# Patient Record
Sex: Female | Born: 1970 | Race: White | Hispanic: No | Marital: Married | Smoking: Former smoker
Health system: Southern US, Community
[De-identification: ages and names within clinical notes are randomized; demographics above are authoritative.]

## PROBLEM LIST (undated history)

## (undated) DIAGNOSIS — I10 Essential (primary) hypertension: Secondary | ICD-10-CM

## (undated) DIAGNOSIS — K219 Gastro-esophageal reflux disease without esophagitis: Secondary | ICD-10-CM

## (undated) HISTORY — PX: MASTOIDECTOMY: SHX711

## (undated) HISTORY — PX: ENDOMETRIAL ABLATION: SHX621

---

## 2000-01-05 ENCOUNTER — Inpatient Hospital Stay (HOSPITAL_COMMUNITY): Admission: AD | Admit: 2000-01-05 | Discharge: 2000-01-07 | Payer: Self-pay | Admitting: *Deleted

## 2001-03-12 ENCOUNTER — Other Ambulatory Visit: Admission: RE | Admit: 2001-03-12 | Discharge: 2001-03-12 | Payer: Self-pay | Admitting: *Deleted

## 2001-04-19 ENCOUNTER — Encounter: Admission: RE | Admit: 2001-04-19 | Discharge: 2001-04-19 | Payer: Self-pay | Admitting: Family Medicine

## 2001-04-19 ENCOUNTER — Encounter: Payer: Self-pay | Admitting: Family Medicine

## 2002-10-15 ENCOUNTER — Other Ambulatory Visit: Admission: RE | Admit: 2002-10-15 | Discharge: 2002-10-15 | Payer: Self-pay | Admitting: Obstetrics and Gynecology

## 2009-09-10 ENCOUNTER — Ambulatory Visit (HOSPITAL_COMMUNITY)
Admission: RE | Admit: 2009-09-10 | Discharge: 2009-09-10 | Payer: Self-pay | Source: Home / Self Care | Admitting: Otolaryngology

## 2009-12-06 ENCOUNTER — Emergency Department (HOSPITAL_BASED_OUTPATIENT_CLINIC_OR_DEPARTMENT_OTHER)
Admission: EM | Admit: 2009-12-06 | Discharge: 2009-12-06 | Payer: Self-pay | Source: Home / Self Care | Admitting: Emergency Medicine

## 2009-12-06 ENCOUNTER — Ambulatory Visit: Payer: Self-pay | Admitting: Diagnostic Radiology

## 2009-12-15 ENCOUNTER — Emergency Department (HOSPITAL_BASED_OUTPATIENT_CLINIC_OR_DEPARTMENT_OTHER): Admission: EM | Admit: 2009-12-15 | Discharge: 2009-12-15 | Payer: Self-pay | Admitting: Emergency Medicine

## 2010-04-06 ENCOUNTER — Encounter: Admission: RE | Admit: 2010-04-06 | Discharge: 2010-04-06 | Payer: Self-pay | Admitting: Obstetrics and Gynecology

## 2010-04-16 ENCOUNTER — Encounter: Admission: RE | Admit: 2010-04-16 | Discharge: 2010-04-16 | Payer: Self-pay | Admitting: Obstetrics and Gynecology

## 2010-09-20 LAB — DIFFERENTIAL
Basophils Relative: 1 % (ref 0–1)
Eosinophils Absolute: 0.1 10*3/uL (ref 0.0–0.7)
Eosinophils Relative: 2 % (ref 0–5)
Monocytes Relative: 9 % (ref 3–12)
Neutrophils Relative %: 61 % (ref 43–77)

## 2010-09-20 LAB — BASIC METABOLIC PANEL
BUN: 10 mg/dL (ref 6–23)
CO2: 26 mEq/L (ref 19–32)
Calcium: 9.4 mg/dL (ref 8.4–10.5)
Chloride: 108 mEq/L (ref 96–112)
Creatinine, Ser: 0.7 mg/dL (ref 0.4–1.2)
GFR calc Af Amer: >60 mL/min (ref >60)
GFR calc non Af Amer: >60 mL/min (ref >60)
Glucose, Bld: 90 mg/dL (ref 70–99)
Potassium: 4.2 mEq/L (ref 3.5–5.1)
Sodium: 145 mEq/L (ref 135–145)

## 2010-09-20 LAB — POCT CARDIAC MARKERS
Myoglobin, poc: 43 ng/mL (ref 12–200)
Myoglobin, poc: 62 ng/mL (ref 12–200)
Troponin i, poc: <0.05 ng/mL (ref 0.00–0.09)
Troponin i, poc: <0.05 ng/mL (ref 0.00–0.09)

## 2010-09-20 LAB — CBC
MCHC: 34 g/dL (ref 30.0–36.0)
MCV: 90.4 fL (ref 78.0–100.0)
Platelets: 201 10*3/uL (ref 150–400)
RBC: 4.74 MIL/uL (ref 3.87–5.11)

## 2010-09-20 LAB — D-DIMER, QUANTITATIVE: D-Dimer, Quant: <0.22 ug/mL-FEU (ref 0.00–0.48)

## 2010-09-27 LAB — CBC
HCT: 42.5 % (ref 36.0–46.0)
MCHC: 34.9 g/dL (ref 30.0–36.0)
MCV: 92.7 fL (ref 78.0–100.0)
Platelets: 203 10*3/uL (ref 150–400)
RDW: 12.9 % (ref 11.5–15.5)

## 2010-09-27 LAB — DIFFERENTIAL
Lymphocytes Relative: 15 % (ref 12–46)
Monocytes Absolute: 0.8 10*3/uL (ref 0.1–1.0)
Monocytes Relative: 9 % (ref 3–12)
Neutro Abs: 7.5 10*3/uL (ref 1.7–7.7)
Neutrophils Relative %: 75 % (ref 43–77)

## 2010-09-27 LAB — APTT: aPTT: 28 seconds (ref 24–37)

## 2010-09-27 LAB — COMPREHENSIVE METABOLIC PANEL
Albumin: 4.1 g/dL (ref 3.5–5.2)
BUN: 8 mg/dL (ref 6–23)
Calcium: 9.7 mg/dL (ref 8.4–10.5)
Creatinine, Ser: 0.68 mg/dL (ref 0.4–1.2)
Glucose, Bld: 126 mg/dL — ABNORMAL HIGH (ref 70–99)
Total Protein: 7.2 g/dL (ref 6.0–8.3)

## 2010-09-27 LAB — PROTIME-INR
INR: 1.07 (ref 0.00–1.49)
Prothrombin Time: 13.8 seconds (ref 11.6–15.2)

## 2010-11-19 NOTE — H&P (Signed)
[facility]  Patient:    Christine Foster, Christine Foster                       MRN: [id number] Adm. Date:  01/05/00 Attending:  Lenoard Aden, M.D.                         History and Physical  CHIEF COMPLAINT:                  Spontaneous rupture of membranes in active labor.  HISTORY OF PRESENT ILLNESS:       The patient is a 40 year old white female, G3, P1-0-2-1, EDD of December 31, 1999, at 40+ weeks with spontaneous rupture of membranes at 1 p.m. in active labor.  PAST MEDICAL HISTORY:             Remarkable for two TABs in 1989 and 1991 and an uncomplicated vaginal delivery of a 7 pound 6 ounce female in 34.  She has a family history of cardiovascular disease, diabetes, and kidney disease. Her past medical history is remarkable for irritable bowel syndrome and one pregnancy complicated by preterm labor.  ALLERGIES:                        No known drug allergies.  MEDICATIONS:                      Prenatal vitamins and iron.  PRENATAL LABORATORY DATA:         Blood type 0 negative, Rh antibody negative, VDRL nonreactive, hepatitis B surface antigen negative, HIV nonreactive, GC and chlamydia negative, group B strep negative.  PHYSICAL EXAMINATION:  GENERAL:                          A well-developed, well-nourished white female in no apparent distress.  HEENT:                            Normal.  LUNGS:                            Clear.  HEART:                            Regular rate and rhythm.  ABDOMEN:                          Soft, gravid, nontender.  Estimated fetal weight 7-1/2 to 8 pounds.  CERVIX:                           Per R.N., 5, 90% vertex, 0, clear fluid noted.  EXTREMITIES:                      No cords.  NEUROLOGIC:                       Nonfocal.  IMPRESSION:                       Term intrauterine pregnancy with spontaneous rupture of membranes in active labor.  PLAN:  Admit.  Anticipate  vaginal delivery, external fetal monitoring. DD:  01/05/00 TD:  01/05/00 Job: [id number]

## 2010-11-19 NOTE — Op Note (Signed)
[facility]  Patient:    Christine Foster, Christine Foster                       MRN: [id number] Proc. Date: 01/05/00 Attending:  Lenoard Aden, M.D.                           Operative Report  SURGEON:                      Lenoard Aden, M.D.  DELIVERY INDICATIONS/PREOPERATIVE DIAGNOSIS:  A 40+ week intrauterine pregnancy  with nonreassuring fetal heart rate tracing, deep variable decelerations, and poor maternal expulsive efforts.  POSTOPERATIVE DIAGNOSIS:      A 40+ week intrauterine pregnancy with nonreassuring fetal heart rate tracing, deep variable decelerations, and poor maternal expulsive efforts, plus nuchal cord times 2.  The patient had been pushing for approximately 30 minutes, when deep variable accelerations, with slow return to baseline were  noted.  Good variability was noted.  Fetal heart tones down to the 80s-90s, maintaining in the 100-110 beat per minute range.  Due to the persistent nonreassurance of this tracing and inadequate expulsive efforts, fetal vertex is noted to be at +3, and left occipital anterior less than 45 degrees.  PROCEDURE:                    A vacuum procedure and assisted vaginal delivery.  DESCRIPTION OF PROCEDURE:     A vacuum procedure and assisted vaginal delivery ere explained to the patient and to her husband.  They acknowledged and desired to proceed.  At this time a ______vac M-cup is applied properly.  The fetal vertex, with three pulls the fetal vertex is delivered over a second-degree laceration.  Full-term living female fetus with a nuchal cord x 2, reduced on the perineum.  Apgars 8 and 9.  The fetus is bulb suctioned after delivery.  The cervix is inspected and found to be without lacerations.  The placenta was delivered spontaneously and intact.  A three-vessel cord is noted.  Inspection reveals no  inspection of lateral vaginal wall lacerations or periurethral lacerations. There is a small  extension of the midline laceration over to the left side of the vagina. This is repaired in the standard fashion using a #3-0 Vicryl Rapide, and using local anesthetic.  At this time reinspection reveals good hemostasis and no lacerations.  Estimated blood loss is 600 cc.  The patient tolerates this procedure well and is recovering with the baby, in good condition. DD:  01/05/00 TD:  01/05/00 Job: [id number]

## 2011-04-25 ENCOUNTER — Other Ambulatory Visit: Payer: Self-pay | Admitting: Obstetrics and Gynecology

## 2011-04-25 DIAGNOSIS — Z1231 Encounter for screening mammogram for malignant neoplasm of breast: Secondary | ICD-10-CM

## 2011-05-10 ENCOUNTER — Ambulatory Visit: Payer: Self-pay

## 2011-05-25 ENCOUNTER — Ambulatory Visit: Payer: Self-pay

## 2011-05-30 ENCOUNTER — Ambulatory Visit
Admission: RE | Admit: 2011-05-30 | Discharge: 2011-05-30 | Disposition: A | Discharge status: Home / Self Care | Payer: Self-pay | Source: Ambulatory Visit | Attending: Obstetrics and Gynecology | Admitting: Obstetrics and Gynecology

## 2011-05-30 DIAGNOSIS — Z1231 Encounter for screening mammogram for malignant neoplasm of breast: Secondary | ICD-10-CM

## 2012-01-17 ENCOUNTER — Other Ambulatory Visit: Payer: Self-pay | Admitting: Otolaryngology

## 2012-01-17 DIAGNOSIS — H7100 Cholesteatoma of attic, unspecified ear: Secondary | ICD-10-CM

## 2012-01-17 DIAGNOSIS — H95129 Granulation of postmastoidectomy cavity, unspecified ear: Secondary | ICD-10-CM

## 2012-01-24 ENCOUNTER — Ambulatory Visit
Admission: RE | Admit: 2012-01-24 | Discharge: 2012-01-24 | Disposition: A | Discharge status: Home / Self Care | Payer: BC Managed Care – PPO | Source: Ambulatory Visit | Attending: Otolaryngology | Admitting: Otolaryngology

## 2012-01-24 DIAGNOSIS — H7100 Cholesteatoma of attic, unspecified ear: Secondary | ICD-10-CM

## 2012-01-24 DIAGNOSIS — H95129 Granulation of postmastoidectomy cavity, unspecified ear: Secondary | ICD-10-CM

## 2012-01-24 MED ORDER — IOHEXOL 300 MG/ML  SOLN
75.0000 mL | Freq: Once | INTRAMUSCULAR | Status: AC | PRN
  Administered 2012-01-24: 75 mL via INTRAVENOUS

## 2012-02-21 ENCOUNTER — Other Ambulatory Visit: Payer: Self-pay | Admitting: Otolaryngology

## 2012-04-25 ENCOUNTER — Other Ambulatory Visit: Payer: Self-pay | Admitting: Obstetrics and Gynecology

## 2012-04-25 DIAGNOSIS — Z1231 Encounter for screening mammogram for malignant neoplasm of breast: Secondary | ICD-10-CM

## 2012-06-06 ENCOUNTER — Ambulatory Visit
Admission: RE | Admit: 2012-06-06 | Discharge: 2012-06-06 | Disposition: A | Discharge status: Home / Self Care | Payer: BC Managed Care – PPO | Source: Ambulatory Visit | Attending: Obstetrics and Gynecology | Admitting: Obstetrics and Gynecology

## 2012-06-06 DIAGNOSIS — Z1231 Encounter for screening mammogram for malignant neoplasm of breast: Secondary | ICD-10-CM

## 2012-12-12 IMAGING — CT CT TEMPORAL BONES W/ CM
4 of 6 series · 18 of 30 positions shown, 19 images · IV contrast (75CC OMNI 300)
Comparison: None.

CLINICAL DATA: Left ear mixed hearing loss for 1 year.

CT TEMPORAL BONES WITH CONTRAST
TECHNIQUE: Axial and coronal plane CT imaging of the petrous
temporal bones was performed with thin-collimation image
reconstruction after intravenous contrast administration.
Multiplanar CT image reconstructions were also generated.
Contrast: 75mL OMNIPAQUE IOHEXOL 300 MG/ML  SOLN

[Series 3: ax mag right · axial · 0.20mm/px · z∈[-1,+24]mm · 3 of 161 slices shown]
[im 41/161  bone]
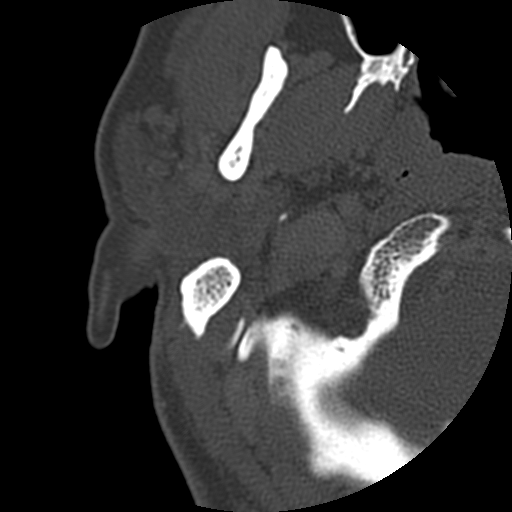
[im 81/161  bone]
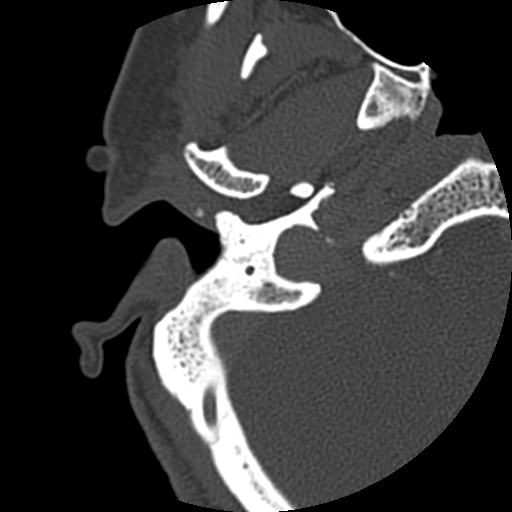
[im 121/161  bone]
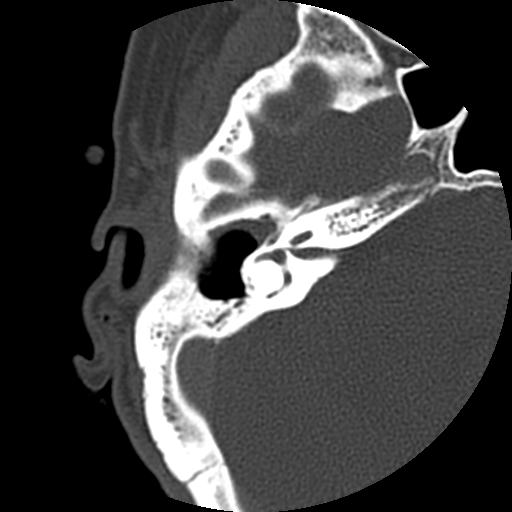

[Series 200: cor · axial · 0.33mm/px · z∈[+13,+98]mm · 3 of 81 slices shown, 4 images]
[im 1/81  brain]
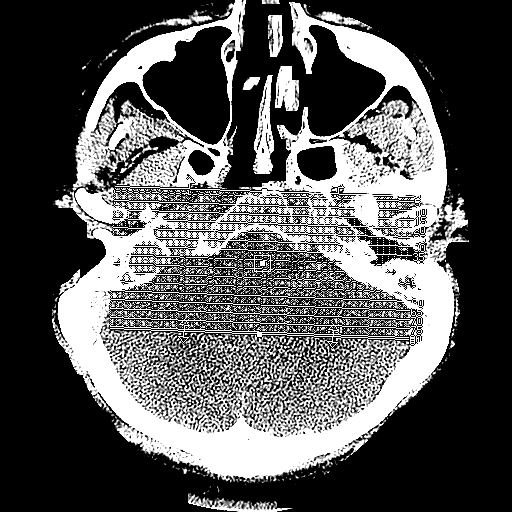
[im 1/81  bone]
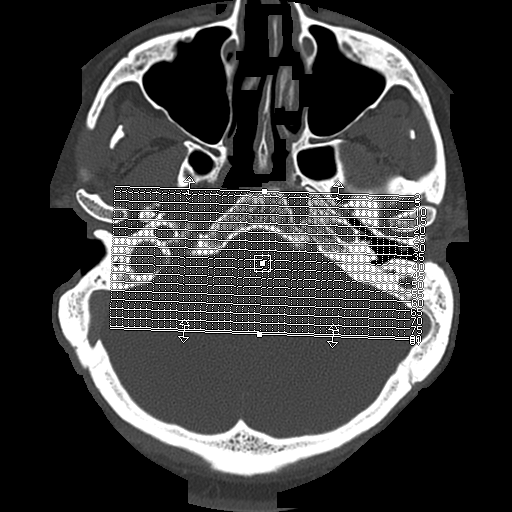
[im 41/81  bone]
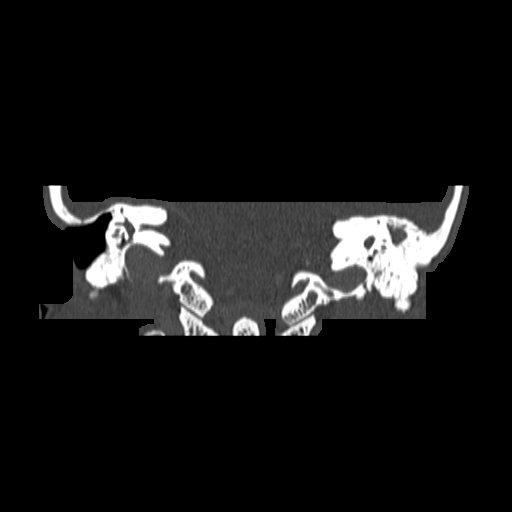
[im 81/81  bone]
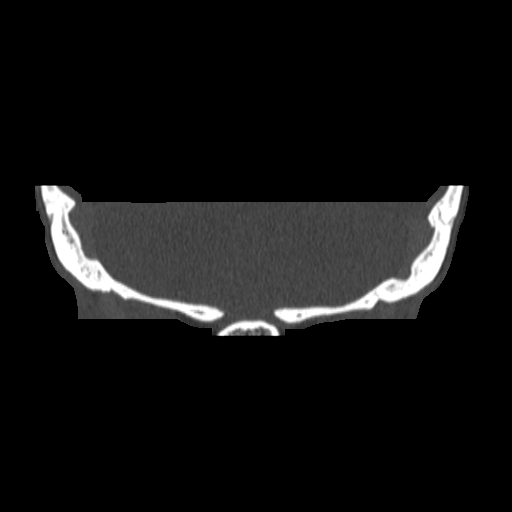

[Series 300: rt cor · coronal · 0.20mm/px · 6 of 249 slices shown]
[im 36/249  bone]
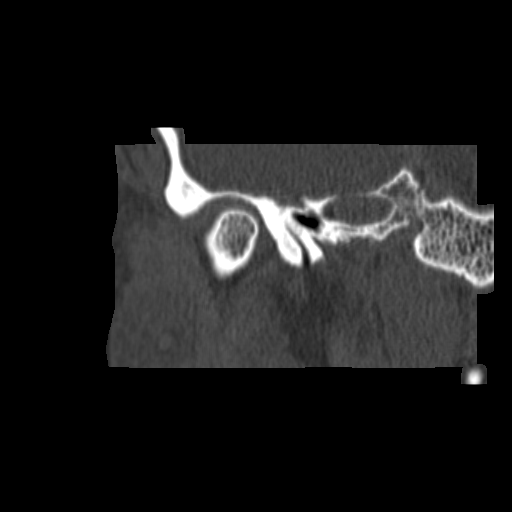
[im 71/249  bone]
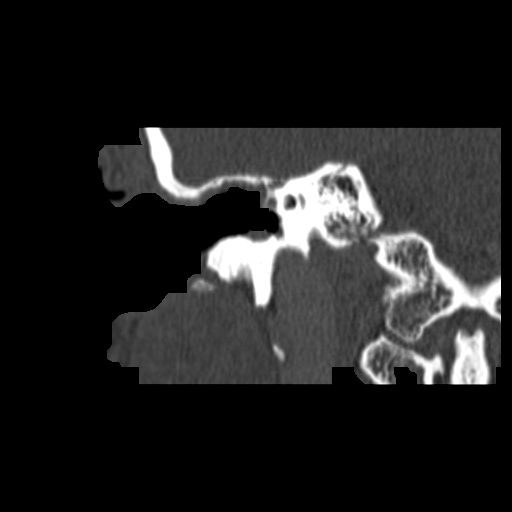
[im 107/249  bone]
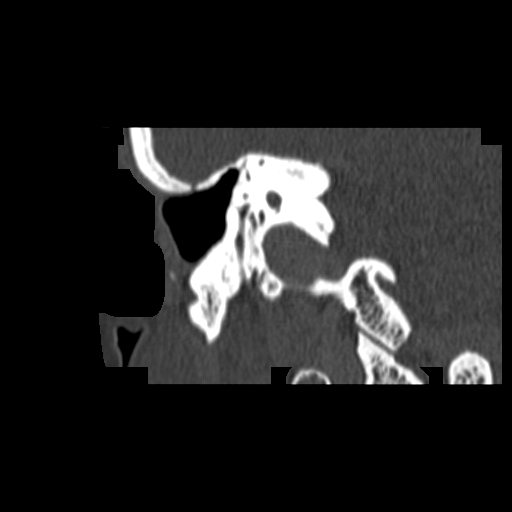
[im 142/249  bone]
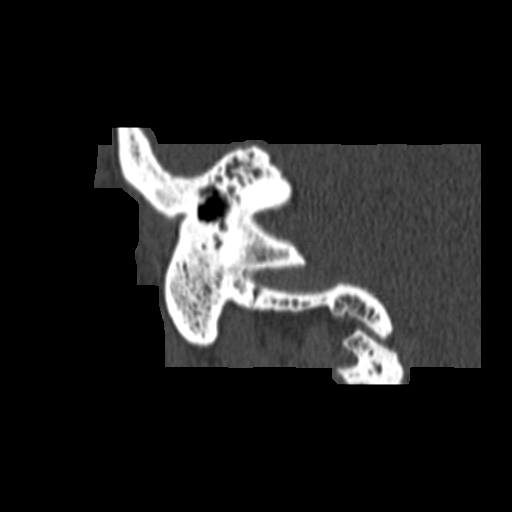
[im 178/249  bone]
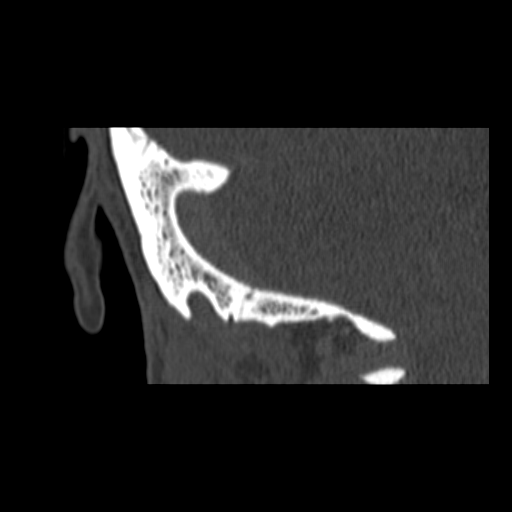
[im 213/249  bone]
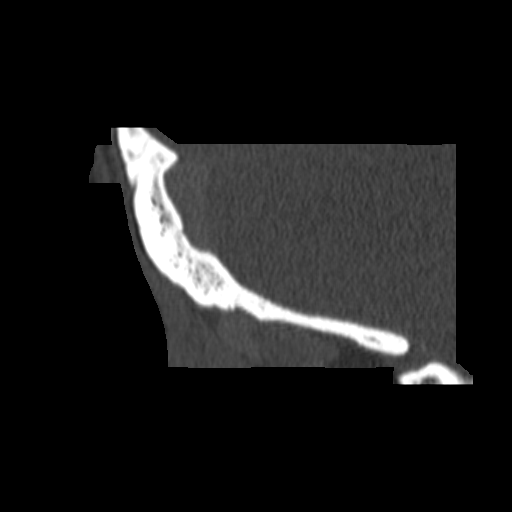

[Series 400: lt cor · coronal · 0.20mm/px · 6 of 243 slices shown]
[im 35/243  bone]
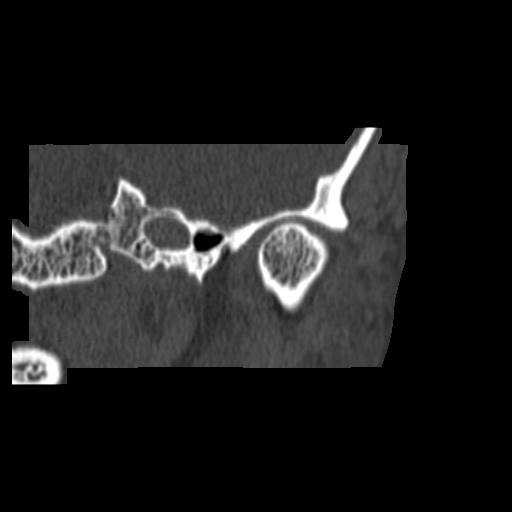
[im 70/243  bone]
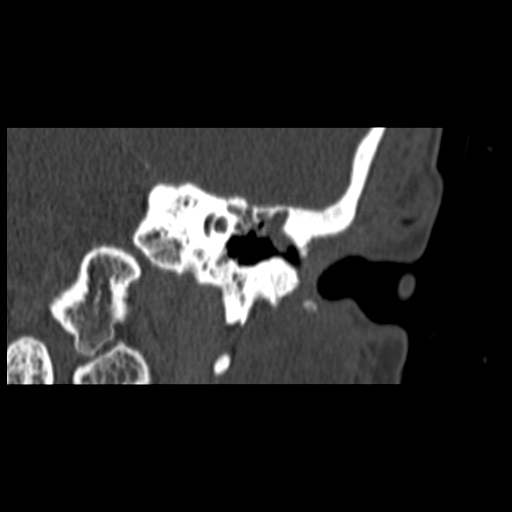
[im 104/243  bone]
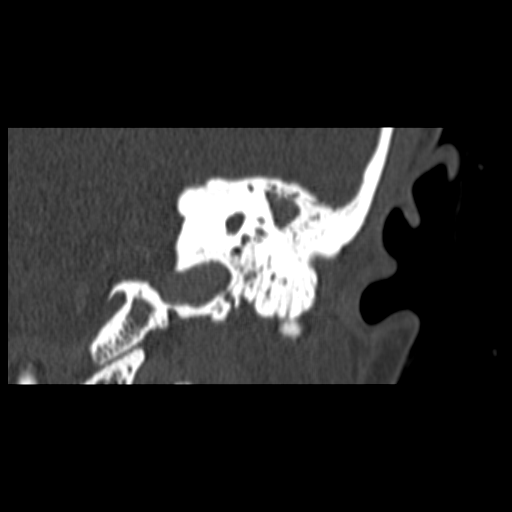
[im 139/243  bone]
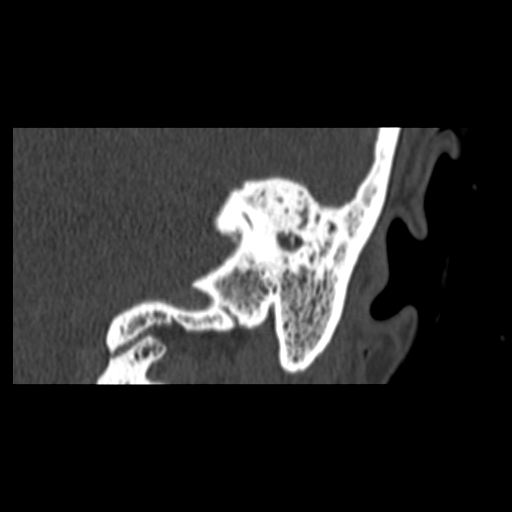
[im 173/243  bone]
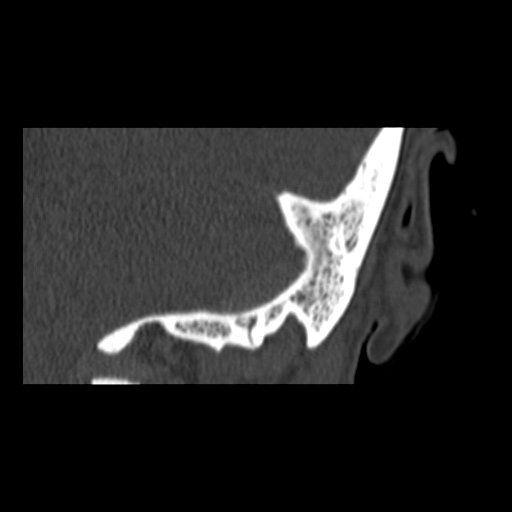
[im 208/243  bone]
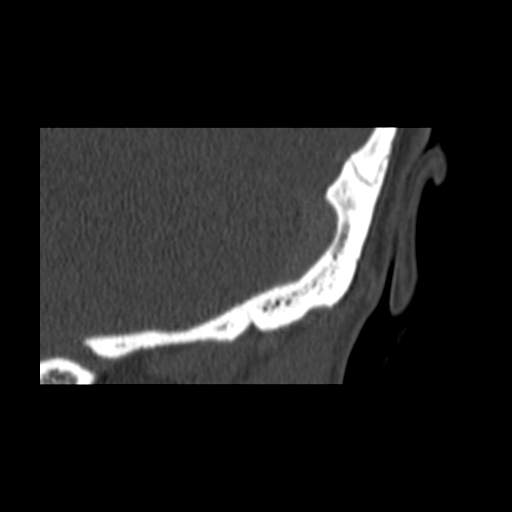

[18 of 30 positions shown; findings below may reference images not displayed]

FINDINGS: Right ear:  Prior modified radical mastoidectomy. Incompletely
evaluated right-sided Annalee implant.  External canal widely patent.
Moderate right tympanic membrane thickening.  Poorly defined
ossicular chain.  Long arm of the malleus is observed centrally.
Soft tissue density erodes the tegmen tympani on the right tegmen
mastoideum is intact.  Soft tissue density medially adjacent to the
facial nerve canal. Inner ear structures normal.

Left ear: Cholesteatoma surrounds the head of the malleus
obliterating Prussak's space. This cholesteatoma extends downward
projecting into the bony external canal. There is destruction of
the scutum.  Slight thinning tegmen tympani ( image 90 series 400).
This cholesteatoma measures approximately 4 x 5 x 8 mm.  Tympanic
membrane is intact.  Incus and stapes grossly normal in position.
There is posterior extension into the mastoid where there is
chronic sclerosis.  Tegmen mastoideum slightly thinned  (images
115, 128, series 400).  Inner ear structures normal.

Visualized intracranial compartment unremarkable.
IMPRESSION: Left middle ear cholesteatoma extending into the mastoid as well as
inferiorly into the left external canal.  Slight thinning of the
tegmen tympani and tegmen mastoideum. See comments above.

Chronic changes right ear status post mastoidectomy and Annalee
device.

## 2013-07-30 ENCOUNTER — Other Ambulatory Visit: Payer: Self-pay

## 2013-07-30 DIAGNOSIS — Z1231 Encounter for screening mammogram for malignant neoplasm of breast: Secondary | ICD-10-CM

## 2013-08-15 ENCOUNTER — Ambulatory Visit: Payer: BC Managed Care – PPO

## 2013-08-27 ENCOUNTER — Ambulatory Visit: Payer: BC Managed Care – PPO

## 2013-09-11 ENCOUNTER — Ambulatory Visit
Admission: RE | Admit: 2013-09-11 | Discharge: 2013-09-11 | Disposition: A | Discharge status: Home / Self Care | Payer: BC Managed Care – PPO | Source: Ambulatory Visit

## 2013-09-11 DIAGNOSIS — Z1231 Encounter for screening mammogram for malignant neoplasm of breast: Secondary | ICD-10-CM

## 2016-08-04 ENCOUNTER — Emergency Department (HOSPITAL_COMMUNITY)
Admission: EM | Admit: 2016-08-04 | Discharge: 2016-08-04 | Disposition: A | Discharge status: Home / Self Care | Payer: BLUE CROSS/BLUE SHIELD | Attending: Emergency Medicine | Admitting: Emergency Medicine

## 2016-08-04 ENCOUNTER — Encounter (HOSPITAL_COMMUNITY): Payer: Self-pay | Admitting: Family Medicine

## 2016-08-04 ENCOUNTER — Emergency Department (HOSPITAL_COMMUNITY): Payer: BLUE CROSS/BLUE SHIELD

## 2016-08-04 DIAGNOSIS — I10 Essential (primary) hypertension: Secondary | ICD-10-CM | POA: Diagnosis not present

## 2016-08-04 DIAGNOSIS — R002 Palpitations: Secondary | ICD-10-CM | POA: Diagnosis not present

## 2016-08-04 HISTORY — DX: Essential (primary) hypertension: I10

## 2016-08-04 HISTORY — DX: Gastro-esophageal reflux disease without esophagitis: K21.9

## 2016-08-04 LAB — BASIC METABOLIC PANEL
Anion gap: 10 (ref 5–15)
BUN: 9 mg/dL (ref 6–20)
CALCIUM: 10.3 mg/dL (ref 8.9–10.3)
CHLORIDE: 103 mmol/L (ref 101–111)
CO2: 24 mmol/L (ref 22–32)
CREATININE: 0.84 mg/dL (ref 0.44–1.00)
GFR calc non Af Amer: >60 mL/min (ref >60)
Glucose, Bld: 107 mg/dL — ABNORMAL HIGH (ref 65–99)
Potassium: 4.1 mmol/L (ref 3.5–5.1)
SODIUM: 137 mmol/L (ref 135–145)

## 2016-08-04 LAB — CBC
HCT: 50.9 % — ABNORMAL HIGH (ref 36.0–46.0)
Hemoglobin: 17.4 g/dL — ABNORMAL HIGH (ref 12.0–15.0)
MCH: 32.1 pg (ref 26.0–34.0)
MCHC: 34.2 g/dL (ref 30.0–36.0)
MCV: 93.9 fL (ref 78.0–100.0)
PLATELETS: 264 10*3/uL (ref 150–400)
RBC: 5.42 MIL/uL — AB (ref 3.87–5.11)
RDW: 12.9 % (ref 11.5–15.5)
WBC: 8.4 10*3/uL (ref 4.0–10.5)

## 2016-08-04 LAB — MAGNESIUM: Magnesium: 2 mg/dL (ref 1.7–2.4)

## 2016-08-04 LAB — I-STAT TROPONIN, ED: Troponin i, poc: 0 ng/mL (ref 0.00–0.08)

## 2016-08-04 LAB — TSH: TSH: 4.66 u[IU]/mL — ABNORMAL HIGH (ref 0.350–4.500)

## 2016-08-04 MED ORDER — SODIUM CHLORIDE 0.9 % IV BOLUS (SEPSIS)
1000.0000 mL | Freq: Once | INTRAVENOUS | Status: AC
  Administered 2016-08-04: 1000 mL via INTRAVENOUS

## 2016-08-04 NOTE — ED Triage Notes (Signed)
Pt presents via POV with c/o tachycardia - she noticed her Apple Watch showed a HR  In the 130's today, [facility], and they sent her to the ED for further evaluation of Tachycardia. Pt also reports intermittent left shoulder "twinges" of pain, LUQ intermittent pain, and one episode of diarrhea this morning. She is speaking in complete sentences, is A&Ox4 and non-diaphoretic.

## 2016-08-04 NOTE — Discharge Instructions (Signed)
As discussed, your evaluation today has been largely reassuring.  But, it is important that you monitor your condition carefully, and do not hesitate to return to the ED if you develop new, or concerning changes in your condition. ? ?Otherwise, please follow-up with your physician for appropriate ongoing care. ? ?

## 2016-08-04 NOTE — ED Provider Notes (Signed)
[facility]-EMERGENCY DEPT Provider Note   CSN: [id number] Arrival date & time: 08/04/16  1557     History   Chief Complaint Chief Complaint  Patient presents with  . Tachycardia    HPI Christine Foster is a 46 y.o. female.  HPI  Patient presents with concern of tachycardia. Currently the patient has no glans, including no chest pain. However, earlier today, the patient had multiple episodes of tachycardia, mild palpitations, but no chest pain. She states that she is generally well, denies chronic medical problems. Today, the patient awoke with abdominal discomfort, had episodes of diarrhea, no vomiting. That resolved spontaneously, and since this morning she has had no additional episodes of loose stool. However, during the day she has had multiple episodes of tachycardia. No other clear precipitants, no clear relieving or exacerbating factors. She went to urgent care, and was sent here for evaluation.   Past Medical History:  Diagnosis Date  . GERD (gastroesophageal reflux disease)   . Hypertension     There are no active problems to display for this patient.   Past Surgical History:  Procedure Laterality Date  . ENDOMETRIAL ABLATION    . MASTOIDECTOMY Right     OB History    No data available       Home Medications    Prior to Admission medications   Medication Sig Start Date End Date Taking? Authorizing Provider  dexlansoprazole (DEXILANT) 60 MG capsule Take 1 capsule by mouth daily.   Yes Historical Provider, MD  lisinopril (PRINIVIL,ZESTRIL) 10 MG tablet Take 10 mg by mouth daily.   Yes Historical Provider, MD    Family History No family history on file.  Social History Social History  Substance Use Topics  . Smoking status: Never Smoker  . Smokeless tobacco: Never Used  . Alcohol use 8.4 oz/week    14 Glasses of wine per week     Comment: 2 glasses/day     Allergies   Patient has no known allergies.   Review of Systems Review of Systems    Constitutional:       Per HPI, otherwise negative  HENT:       Per HPI, otherwise negative  Respiratory:       Per HPI, otherwise negative  Cardiovascular:       Per HPI, otherwise negative  Gastrointestinal: Positive for diarrhea. Negative for abdominal pain and vomiting.  Endocrine:       Negative aside from HPI  Genitourinary:       Neg aside from HPI   Musculoskeletal:       Per HPI, otherwise negative  Skin: Negative.   Neurological: Negative for syncope.     Physical Exam Updated Vital Signs BP 111/76   Pulse 80   Temp 98 F (36.7 C) (Oral)   Resp 18   Ht 5\' 2"  (1.575 m)   Wt 165 lb (74.8 kg)   SpO2 98%   BMI 30.18 kg/m   Physical Exam  Constitutional: She is oriented to person, place, and time. She appears well-developed and well-nourished. No distress.  HENT:  Head: Normocephalic and atraumatic.  Eyes: Conjunctivae and EOM are normal.  Cardiovascular: Normal rate and regular rhythm.   Pulmonary/Chest: Effort normal and breath sounds normal. No stridor. No respiratory distress.  Abdominal: She exhibits no distension.  Musculoskeletal: She exhibits no edema.  Neurological: She is alert and oriented to person, place, and time. No cranial nerve deficit.  Skin: Skin is warm and dry.  Psychiatric:  She has a normal mood and affect.  Nursing note and vitals reviewed.    ED Treatments / Results  Labs (all labs ordered are listed, but only abnormal results are displayed) Labs Reviewed  BASIC METABOLIC PANEL - Abnormal; Notable for the following:       Result Value   Glucose, Bld 107 (*)    All other components within normal limits  CBC - Abnormal; Notable for the following:    RBC 5.42 (*)    Hemoglobin 17.4 (*)    HCT 50.9 (*)    All other components within normal limits  MAGNESIUM  TSH  I-STAT TROPOININ, ED    EKG  EKG Interpretation  Date/Time:  Thursday August 04 2016 19:30:28 EST Ventricular Rate:  83 PR Interval:    QRS Duration: 86 QT  Interval:  371 QTC Calculation: 436 R Axis:   64 Text Interpretation:  Sinus rhythm Normal ECG Confirmed by Gerhard MunchLOCKWOOD, Christine Essman  MD [phone]([id number]) on 08/04/2016 7:48:16 PM       Radiology Dg Chest 2 View  Result Date: 08/04/2016 CLINICAL DATA:  Onset of tachycardia and lightheadedness today. History of hypertension. Nonsmoker. EXAM: CHEST  2 VIEW COMPARISON:  Chest x-ray of December 06, 2009 FINDINGS: The lungs are well-expanded and clear. The heart and pulmonary vascularity are normal. The mediastinum is normal in width. There is no pleural effusion. The bony thorax exhibits no acute abnormality. IMPRESSION: There is no active cardiopulmonary disease. Electronically Signed   By: David  SwazilandJordan M.D.   On: 08/04/2016 17:20    Procedures Procedures (including critical care time)  Medications Ordered in ED Medications  sodium chloride 0.9 % bolus 1,000 mL (0 mLs Intravenous Stopped 08/04/16 2044)     Initial Impression / Assessment and Plan / ED Course  I have reviewed the triage vital signs and the nursing notes.  Pertinent labs & imaging results that were available during my care of the patient were reviewed by me and considered in my medical decision making (see chart for details).  9:52 PM Patient in no distress. She and her husband are aware of all findings. With reassuring labs, vitals, is low suspicion for sustained arrhythmia, no evidence for ACS. With no ongoing symptoms, patient discharged in stable condition. Thyroid studies pending, to be followed as an outpatient.   Final Clinical Impressions(s) / ED Diagnoses   Final diagnoses:  Palpitations     Gerhard Munchobert Akiba Melfi, MD 08/04/16 2154

## 2017-06-23 IMAGING — DX DG CHEST 2V
2 series · 2 of 2 positions shown · non-contrast
Comparison: Chest x-ray of December 06, 2009

CLINICAL DATA: Onset of tachycardia and lightheadedness today.
History of hypertension. Nonsmoker.

EXAM:
CHEST  2 VIEW

[chest pa]
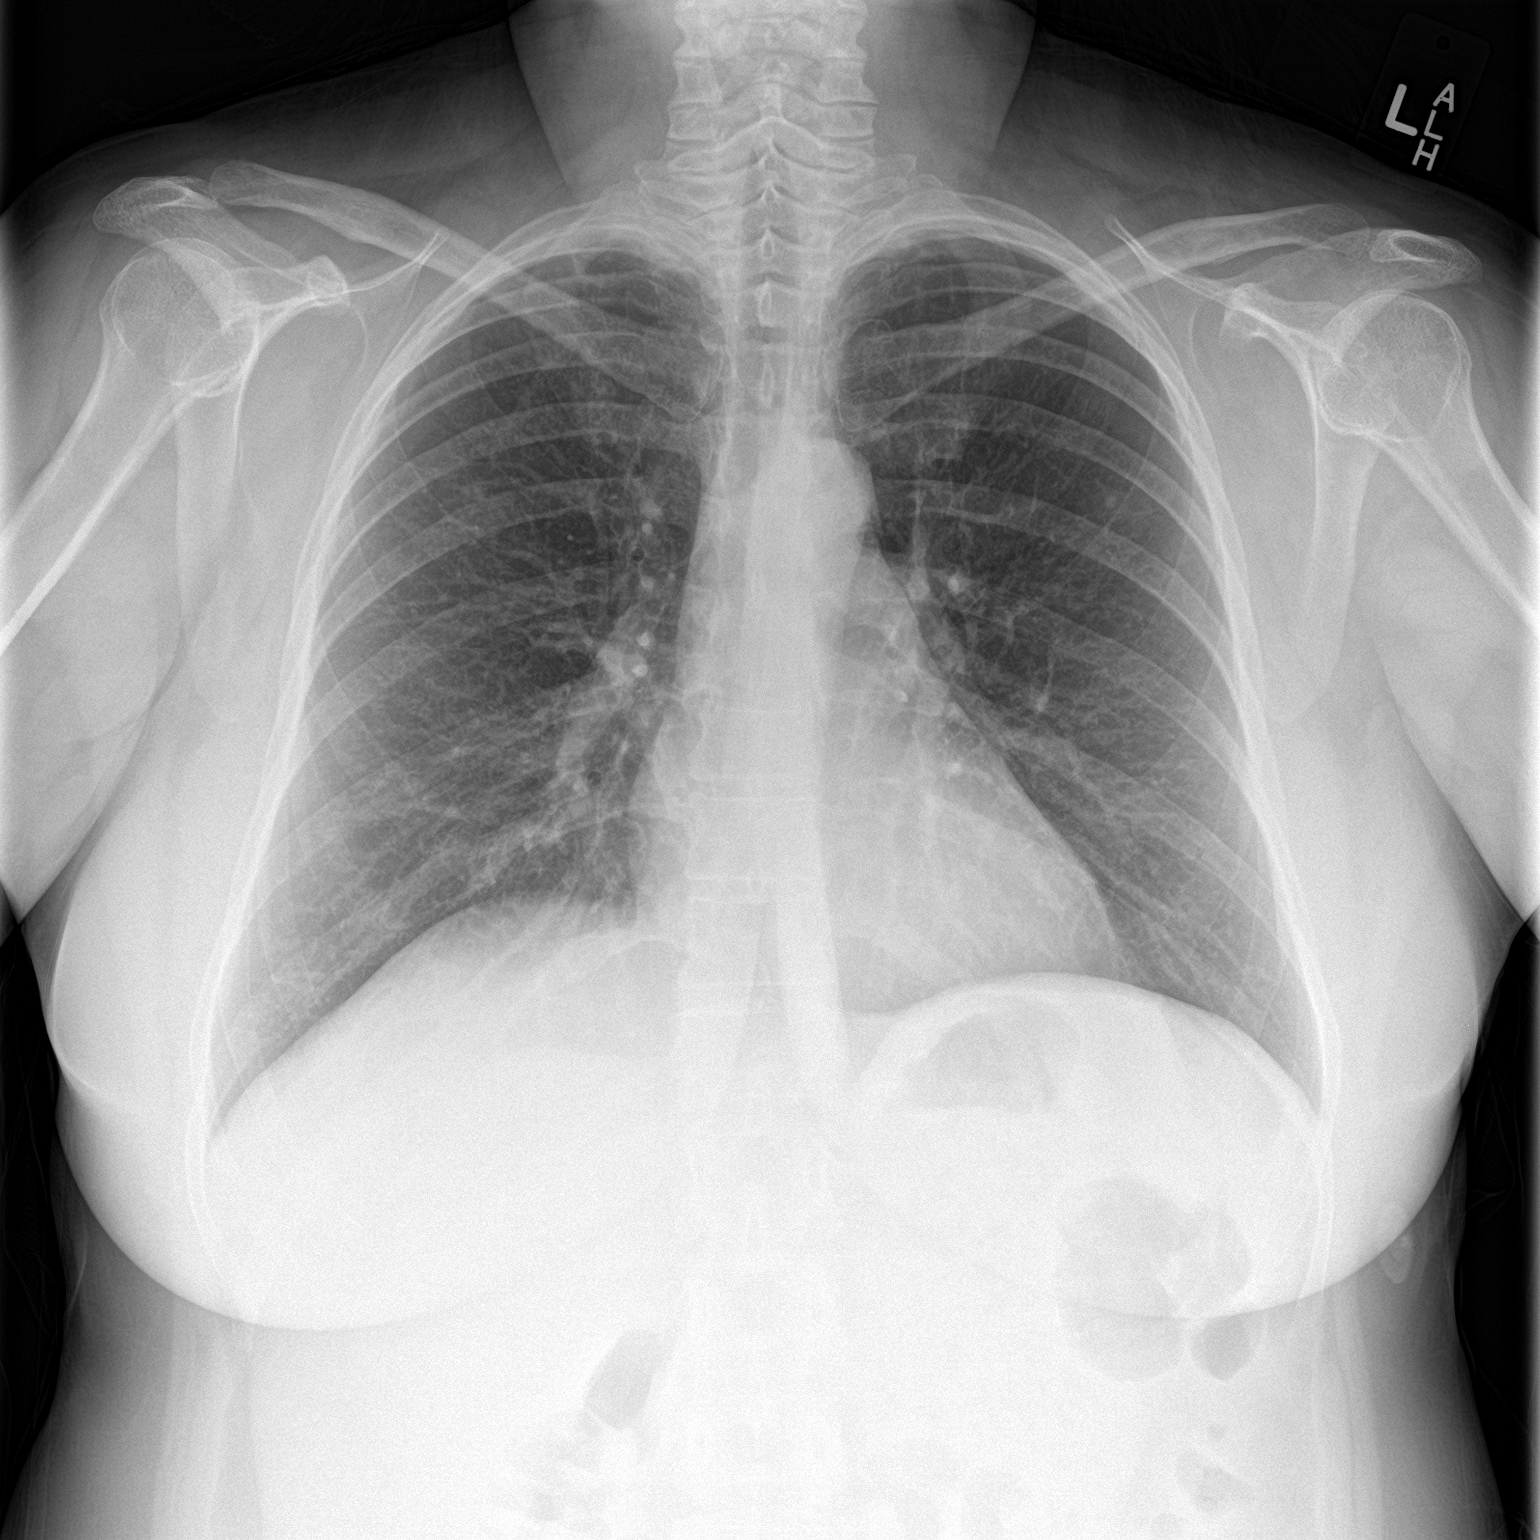

[chest lat]
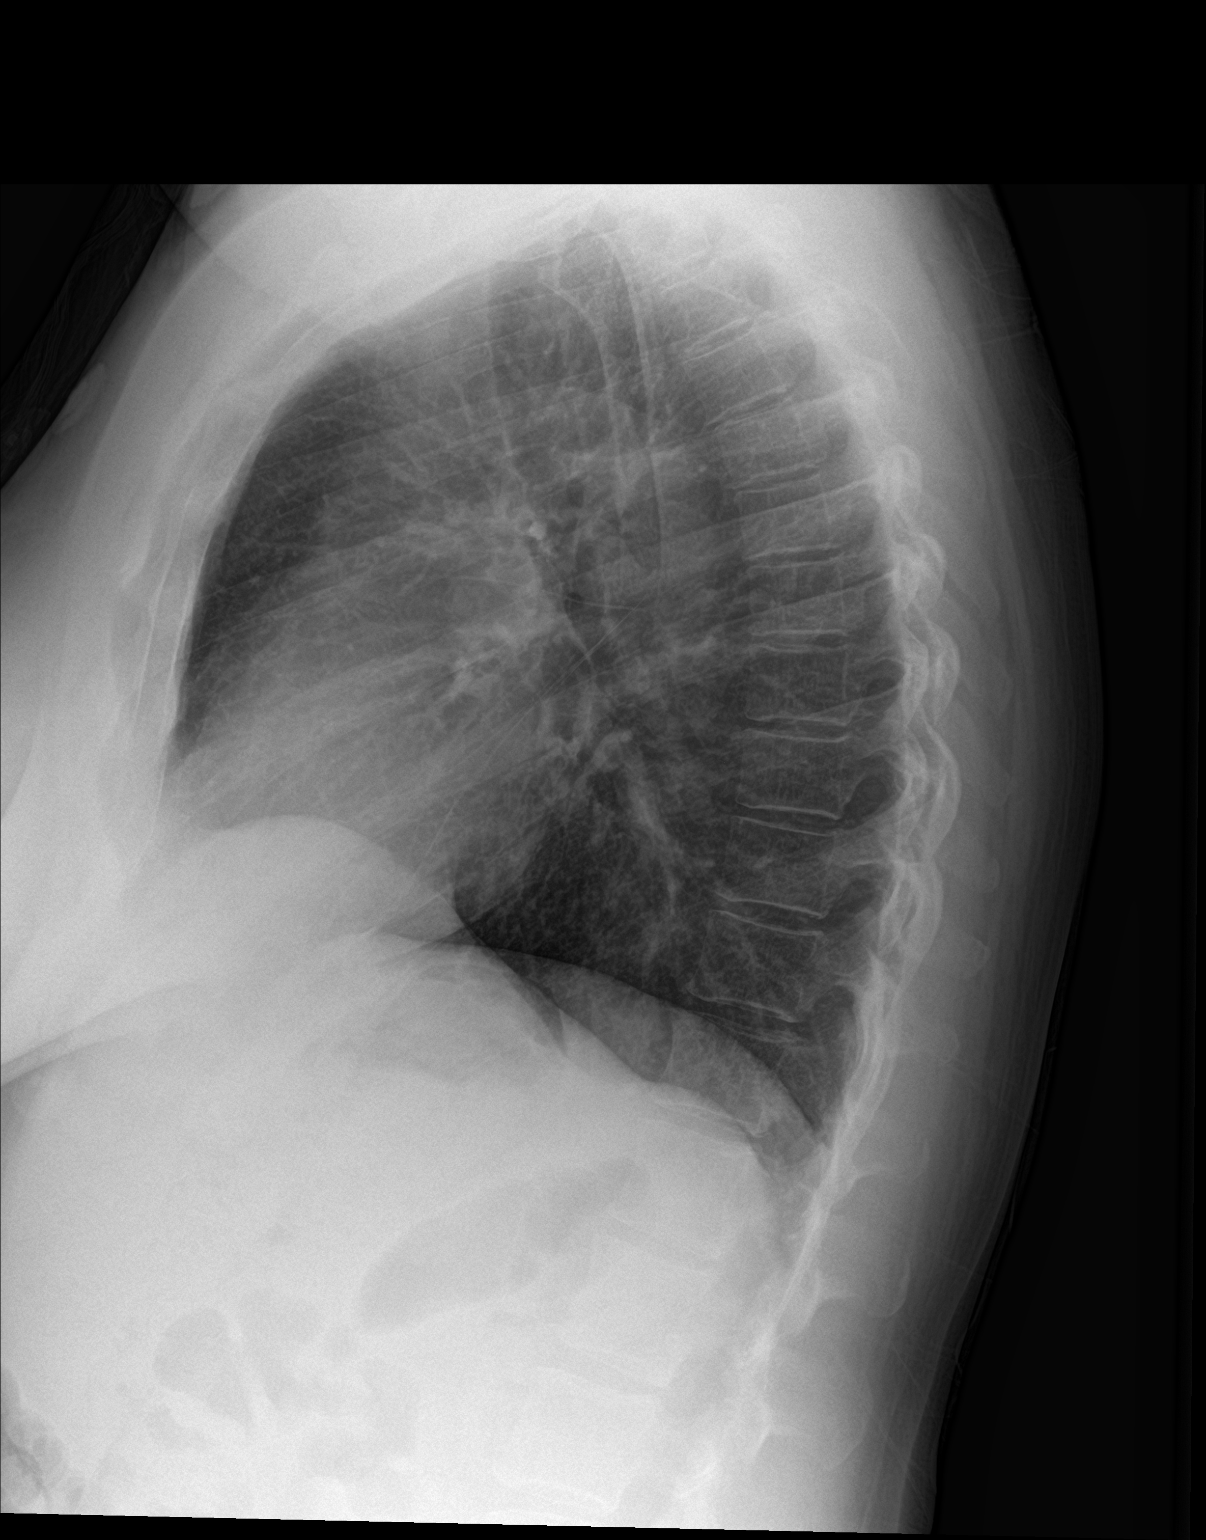

[2 of 2 positions shown; findings below may reference images not displayed]

FINDINGS: The lungs are well-expanded and clear. The heart and pulmonary
vascularity are normal. The mediastinum is normal in width. There is
no pleural effusion. The bony thorax exhibits no acute abnormality.
IMPRESSION: There is no active cardiopulmonary disease.

## 2019-02-27 LAB — HM PAP SMEAR: HM Pap smear: NORMAL

## 2020-02-02 LAB — HM COLONOSCOPY

## 2022-04-21 LAB — RESULTS CONSOLE HPV: CHL HPV: NEGATIVE

## 2022-04-21 LAB — HM MAMMOGRAPHY

## 2022-04-21 LAB — HM PAP SMEAR: HM Pap smear: NEGATIVE

## 2022-07-13 ENCOUNTER — Ambulatory Visit (INDEPENDENT_AMBULATORY_CARE_PROVIDER_SITE_OTHER): Payer: No Typology Code available for payment source | Admitting: Family Medicine

## 2022-07-13 ENCOUNTER — Encounter: Payer: Self-pay | Admitting: Family Medicine

## 2022-07-13 VITALS — BP 130/84 | HR 75 | Temp 97.2°F | Ht 64.5 in | Wt 173.2 lb

## 2022-07-13 DIAGNOSIS — R7989 Other specified abnormal findings of blood chemistry: Secondary | ICD-10-CM

## 2022-07-13 DIAGNOSIS — K219 Gastro-esophageal reflux disease without esophagitis: Secondary | ICD-10-CM | POA: Diagnosis not present

## 2022-07-13 DIAGNOSIS — I10 Essential (primary) hypertension: Secondary | ICD-10-CM | POA: Diagnosis not present

## 2022-07-13 DIAGNOSIS — E785 Hyperlipidemia, unspecified: Secondary | ICD-10-CM

## 2022-07-13 DIAGNOSIS — K529 Noninfective gastroenteritis and colitis, unspecified: Secondary | ICD-10-CM

## 2022-07-13 MED ORDER — LISINOPRIL 10 MG PO TABS: 10.0000 mg | ORAL_TABLET | Freq: Every day | ORAL | 3 refills | Status: AC

## 2022-07-13 MED ORDER — ATORVASTATIN CALCIUM 10 MG PO TABS: 10.0000 mg | ORAL_TABLET | Freq: Every day | ORAL | 3 refills | Status: DC

## 2022-07-13 MED ORDER — OMEPRAZOLE 20 MG PO CPDR: 20.0000 mg | DELAYED_RELEASE_CAPSULE | Freq: Every day | ORAL | 3 refills | Status: AC

## 2022-07-13 NOTE — Patient Instructions (Signed)
It was a pleasure meeting with you today. Here are the instructions and the plan we've discussed:  1. Medication Refills:    - Continue taking **Lisinopril** as previously prescribed for blood pressure management.    - Resume taking **Atorvastatin** for cholesterol management.    - **Omeprazole** has been prescribed again for your acid reflux; take as directed.  2. Follow-Up Labs:    - If you are unable to access the lab results from your recent tests, please return in about one month for blood work to re-evaluate your liver function and to see the effect of Atorvastatin on your cholesterol levels.     3. Gastroenterology Consult:    - Given your history of GI issues and recent symptoms, it would be beneficial to consult with a gastroenterologist. A referral will be provided to help assess the cause of your recurrent GI symptoms.  4. Hydration:    - Continue drinking plenty of water, especially since you have been experiencing some nausea and diarrhea.  5. Observations:    - Watch out for any new symptoms such as blood in stool, chest pain, or shortness of breath. If these occur, please seek medical attention promptly.  6. Lifestyle:    - Maintain any dietary adjustments you know are helpful in managing your GI symptoms.  7. Revisit:    - Plan to revisit in one month's time for a check-up and to discuss any lab results or further evaluations required.  8. MyChart:    - Ensure you have access to MyChart for easy communication and to view lab results when they are uploaded.  It was great having the opportunity to get to know you better today, and I'm looking forward to our continued partnership in managing your health. If you have any questions or concerns before our next appointment, please do not hesitate to reach out.  Wishing you good health, Dr. Grandville Silos

## 2022-07-13 NOTE — Progress Notes (Signed)
Assessment/Plan:   Problem List Items Addressed This Visit       Cardiovascular and Mediastinum   Primary hypertension    The patient's blood pressure is well-controlled with lisinopril 10 mg. Lack of medication may lead to uncontrolled hypertension; continued compliance is crucial.  Plan:  Reinstate lisinopril 10 mg with a reminder of adherence importance for blood pressure control. Re-evaluate blood pressure in 1 month, sooner if any concerning symptoms arise.      Relevant Medications   atorvastatin (LIPITOR) 10 MG tablet   lisinopril (ZESTRIL) 10 MG tablet     Digestive   Gastroesophageal reflux disease     Omeprazole 20 mg appears to control GERD symptoms adequately, and patient expresses a desire to continue this medication.  Plan:  Continue omeprazole 20 mg and reassess symptom control at the next follow-up. GI referral for chronic abdominal pain and possible evaluation of GERD therapy effectiveness.      Relevant Medications   omeprazole (PRILOSEC) 20 MG capsule   Other Relevant Orders   Ambulatory referral to Gastroenterology   Chronic diarrhea    Chronic diarrhea and other issues not addressed will be evaluated pending GI consultation.      Relevant Orders   Ambulatory referral to Gastroenterology     Other   Hyperlipidemia - Primary    Atorvastatin has been effective, and the patient tolerates it well. The recent mild elevation in LFTs raises concerns but the negative hepatitis C and absence of liver abnormalities in prior CT imaging are reassuring.  Plan:  Continue atorvastatin 10 mg and schedule lipid panel follow-up in 3 months to monitor efficacy and safety. Educate patient about symptoms of myopathy/rhabdomyolysis as a rare but serious side effect of statins.      Relevant Medications   atorvastatin (LIPITOR) 10 MG tablet   lisinopril (ZESTRIL) 10 MG tablet   Elevated LFTs    Refer to Gastroenterology for evaluation of elevated LFTs and  chronic abdominal pain. Can also consider hepatic ultrasound to assess liver parenchyma. Repeat CMP including LFTs if recent labs cannot be obtained to determine trend.       Medications Discontinued During This Encounter  Medication Reason   dexlansoprazole (DEXILANT) 60 MG capsule    lisinopril (PRINIVIL,ZESTRIL) 10 MG tablet Reorder   atorvastatin (LIPITOR) 10 MG tablet Reorder   omeprazole (PRILOSEC) 20 MG capsule Reorder      Subjective:  Encounter date: 07/13/2022 CHIEF COMPLAINT: Christine Foster, a 52 year old female, presents to establish care and for prescription refills of her blood pressure and cholesterol medications, along with requesting records for Pap smear and mammogram.   Patient is here to establish care.  She has a job in [place] and also has a home in [place].  She travels between both locations.  She previously had a healthcare provider from her work however this is no longer available through her work and she needs to find a primary care that is where she is coming here today.    HISTORY OF PRESENT ILLNESS:  Problem 1: The patient has a history of hyperlipidemia and has been on atorvastatin 10 mg for the past 6 months and now requires a refill. She has no reported side effects from this medication and wishes to continue it.  Problem 2: The patient also has hypertension and has not experienced recent symptoms such as chest pain, shortness of breath, headaches, or blurry vision. She reports no lower extremity swelling and takes lisinopril 10 mg. The patient has  been normotensive during the visit but has not taken lisinopril for 3 days due to needing a refill.  Problem 3: The patient has a history of elevated liver function tests with mild elevations noted a year ago and seven months prior, before the initiation of statin therapy. She reports a negative Hepatitis C test from January 2023 and no symptoms such as abdominal pain, nausea, vomiting, or diarrhea.  A CT scan from 2019 did not reveal liver abnormalities.  Problem 4: The patient has chronic abdominal pain and GERD. She was previously followed by gastroenterology but has been lost to follow-up. She reports a recent flare of abdominal pain with mild nausea and diarrhea but no blood in the stool. The patient had a normal colonoscopy 3 years ago, an endoscopy in the past, and is currently taking omeprazole 20 mg for GERD. She has taken Dexilant in the past but is not currently on it.  REVIEW OF SYSTEMS: The review is positive for controlled hypertension and hyperlipidemia, chronic abdominal pain without alarming symptoms, and mild elevation in liver function tests without known liver disease. All other systems are reviewed and negative.   Past Surgical History:  Procedure Laterality Date   ENDOMETRIAL ABLATION     MASTOIDECTOMY Right     Outpatient Medications Prior to Visit  Medication Sig Dispense Refill   atorvastatin (LIPITOR) 10 MG tablet Take by mouth.     lisinopril (PRINIVIL,ZESTRIL) 10 MG tablet Take 10 mg by mouth daily.     omeprazole (PRILOSEC) 20 MG capsule Take by mouth.     dexlansoprazole (DEXILANT) 60 MG capsule Take 1 capsule by mouth daily. (Patient not taking: Reported on 07/13/2022)     No facility-administered medications prior to visit.    History reviewed. No pertinent family history.  Social History   Socioeconomic History   Marital status: Married    Spouse name: Not on file   Number of children: Not on file   Years of education: Not on file   Highest education level: Not on file  Occupational History   Not on file  Tobacco Use   Smoking status: Former    Types: Cigarettes    Passive exposure: Never   Smokeless tobacco: Never  Vaping Use   Vaping Use: Never used  Substance and Sexual Activity   Alcohol use: Yes    Alcohol/week: 14.0 standard drinks of alcohol    Types: 14 Glasses of wine per week    Comment: 2 glasses/day   Drug use: Not on file    Sexual activity: Not on file  Other Topics Concern   Not on file  Social History Narrative   Not on file   Social Determinants of Health   Financial Resource Strain: Not on file  Food Insecurity: Not on file  Transportation Needs: Not on file  Physical Activity: Not on file  Stress: Not on file  Social Connections: Not on file  Intimate Partner Violence: Not on file  Objective:  Physical Exam: BP 130/84 (BP Location: Left Arm, Patient Position: Sitting, Cuff Size: Large)   Pulse 75   Temp (!) 97.2 F (36.2 C) (Temporal)   Ht 5' 4.5" (1.638 m)   Wt 173 lb 3.2 oz (78.6 kg)   SpO2 99%   BMI 29.27 kg/m    General: No acute distress. Awake and conversant.  Eyes: Normal conjunctiva, anicteric. Round symmetric pupils.  ENT: Hearing grossly intact. No nasal discharge.  Neck: Neck is supple. No masses or thyromegaly.  Respiratory: Respirations are non-labored. No auditory wheezing.  Skin: Warm. No rashes or ulcers.  Psych: Alert and oriented. Cooperative, Appropriate mood and affect, Normal judgment.  CV: No cyanosis or JVD MSK: Normal ambulation. No clubbing  Neuro: Sensation and CN II-XII grossly normal.        Garner Nash, MD, MS

## 2022-07-17 DIAGNOSIS — K219 Gastro-esophageal reflux disease without esophagitis: Secondary | ICD-10-CM | POA: Insufficient documentation

## 2022-07-17 DIAGNOSIS — E785 Hyperlipidemia, unspecified: Secondary | ICD-10-CM | POA: Insufficient documentation

## 2022-07-17 DIAGNOSIS — I10 Essential (primary) hypertension: Secondary | ICD-10-CM | POA: Insufficient documentation

## 2022-07-17 DIAGNOSIS — K529 Noninfective gastroenteritis and colitis, unspecified: Secondary | ICD-10-CM | POA: Insufficient documentation

## 2022-07-17 DIAGNOSIS — R7989 Other specified abnormal findings of blood chemistry: Secondary | ICD-10-CM | POA: Insufficient documentation

## 2022-07-17 NOTE — Assessment & Plan Note (Signed)
Chronic diarrhea and other issues not addressed will be evaluated pending GI consultation.

## 2022-07-17 NOTE — Assessment & Plan Note (Signed)
The patient's blood pressure is well-controlled with lisinopril 10 mg. Lack of medication may lead to uncontrolled hypertension; continued compliance is crucial.  Plan:  Reinstate lisinopril 10 mg with a reminder of adherence importance for blood pressure control. Re-evaluate blood pressure in 1 month, sooner if any concerning symptoms arise.

## 2022-07-17 NOTE — Assessment & Plan Note (Addendum)
Refer to Gastroenterology for evaluation of elevated LFTs and chronic abdominal pain. Can also consider hepatic ultrasound to assess liver parenchyma. Repeat CMP including LFTs if recent labs cannot be obtained to determine trend.

## 2022-07-17 NOTE — Assessment & Plan Note (Signed)
Atorvastatin has been effective, and the patient tolerates it well. The recent mild elevation in LFTs raises concerns but the negative hepatitis C and absence of liver abnormalities in prior CT imaging are reassuring.  Plan:  Continue atorvastatin 10 mg and schedule lipid panel follow-up in 3 months to monitor efficacy and safety. Educate patient about symptoms of myopathy/rhabdomyolysis as a rare but serious side effect of statins.

## 2022-07-17 NOTE — Assessment & Plan Note (Signed)
Omeprazole 20 mg appears to control GERD symptoms adequately, and patient expresses a desire to continue this medication.  Plan:  Continue omeprazole 20 mg and reassess symptom control at the next follow-up. GI referral for chronic abdominal pain and possible evaluation of GERD therapy effectiveness.

## 2022-08-10 ENCOUNTER — Ambulatory Visit: Payer: BLUE CROSS/BLUE SHIELD | Admitting: Family Medicine

## 2022-08-24 ENCOUNTER — Ambulatory Visit (INDEPENDENT_AMBULATORY_CARE_PROVIDER_SITE_OTHER): Payer: No Typology Code available for payment source | Admitting: Family Medicine

## 2022-08-24 ENCOUNTER — Encounter: Payer: Self-pay | Admitting: Family Medicine

## 2022-08-24 VITALS — BP 122/72 | HR 70 | Temp 97.4°F | Wt 171.6 lb

## 2022-08-24 DIAGNOSIS — R7989 Other specified abnormal findings of blood chemistry: Secondary | ICD-10-CM | POA: Diagnosis not present

## 2022-08-24 DIAGNOSIS — E785 Hyperlipidemia, unspecified: Secondary | ICD-10-CM | POA: Diagnosis not present

## 2022-08-24 NOTE — Assessment & Plan Note (Signed)
The patient has demonstrated slightly elevated liver enzymes and is hesitant to continue atorvastatin due to potential hepatic side effects. Given the context, it is reasonable to hold atorvastatin and recheck liver function.  For cholesterol management, using the ASCVD risk calculator, the patient's 10-year cardiovascular risk was calculated at 3.7%. Since the score is below 5%, the initiation of statin therapy is not indicated unless there is a strong familial history of cardiovascular disease.  Plan to recheck liver enzymes and consider an ultrasound of the liver to rule out fatty liver disease or other hepatobiliary pathology. Encourage the continuation of lifestyle modifications, including dietary changes and reduction of alcohol intake. Monitor blood pressure and continue current antihypertensive medication as it is effectively controlling blood pressure.

## 2022-08-24 NOTE — Progress Notes (Signed)
Assessment/Plan:   Problem List Items Addressed This Visit       Other   Hyperlipidemia   Elevated LFTs - Primary    The patient has demonstrated slightly elevated liver enzymes and is hesitant to continue atorvastatin due to potential hepatic side effects. Given the context, it is reasonable to hold atorvastatin and recheck liver function.  For cholesterol management, using the ASCVD risk calculator, the patient's 10-year cardiovascular risk was calculated at 3.7%. Since the score is below 5%, the initiation of statin therapy is not indicated unless there is a strong familial history of cardiovascular disease.  Plan to recheck liver enzymes and consider an ultrasound of the liver to rule out fatty liver disease or other hepatobiliary pathology. Encourage the continuation of lifestyle modifications, including dietary changes and reduction of alcohol intake. Monitor blood pressure and continue current antihypertensive medication as it is effectively controlling blood pressure.      Relevant Orders   Comp Met (CMET)   Hepatitis B surface antibody,qualitative   Hepatitis C Antibody   US Abdomen Limited RUQ (LIVER/GB)    Medications Discontinued During This Encounter  Medication Reason   atorvastatin (LIPITOR) 10 MG tablet       Subjective:  HPI: Encounter date: 08/24/2022  Christine Foster is a 52 y.o. female who has Hyperlipidemia; Primary hypertension; Gastroesophageal reflux disease; Elevated LFTs; and Chronic diarrhea on their problem list..   She  has a past medical history of GERD (gastroesophageal reflux disease) and Hypertension..   CHIEF COMPLAINT: The patient presented with concerns about cholesterol, liver enzymes, blood pressure, heartburn, and a history of chronic GI issues.  HISTORY OF PRESENT ILLNESS:  Liver Enzymes. The patient reports previously elevated liver enzymes and is concerned about the potential side effects of atorvastatin on liver function. The  patient's liver enzymes were last checked in May of the previous year, and recent tests showed levels slightly above normal. The patient expressed hesitation in continuing atorvastatin and requested a re-evaluation of liver function.  Cholesterol. The patient has a history of high cholesterol with a recent test in October showing an LDL of 101 and total cholesterol of 241.  Patient is concerned to take its atorvastatin as it may affect her liver.  Blood Pressure. The patient has a history of hypertension and is on Lisinopril 10 mg. The current blood pressure reading is well controlled at 122/70.  ROS:  Cardiology: No complaints of chest pain or palpitations Remainder of ROS negative.  [address] Past Surgical History:  Procedure Laterality Date   ENDOMETRIAL ABLATION     MASTOIDECTOMY Right     Outpatient Medications Prior to Visit  Medication Sig Dispense Refill   lisinopril (ZESTRIL) 10 MG tablet Take 1 tablet (10 mg total) by mouth daily. 90 tablet 3   omeprazole (PRILOSEC) 20 MG capsule Take 1 capsule (20 mg total) by mouth daily. 90 capsule 3   atorvastatin (LIPITOR) 10 MG tablet Take 1 tablet (10 mg total) by mouth daily. (Patient not taking: Reported on 08/24/2022) 90 tablet 3   No facility-administered medications prior to visit.    History reviewed. No pertinent family history.  Social History   Socioeconomic History   Marital status: Married    Spouse name: Not on file   Number of children: Not on file   Years of education: Not on file   Highest education level: Not on file  Occupational History   Not on file  Tobacco Use   Smoking status: Former  Types: Cigarettes    Passive exposure: Never   Smokeless tobacco: Never  Vaping Use   Vaping Use: Never used  Substance and Sexual Activity   Alcohol use: Yes    Alcohol/week: 14.0 standard drinks of alcohol    Types: 14 Glasses of wine per week    Comment: 2 glasses/day   Drug use: Not on file   Sexual activity:  Not on file  Other Topics Concern   Not on file  Social History Narrative   Not on file   Social Determinants of Health   Financial Resource Strain: Not on file  Food Insecurity: Not on file  Transportation Needs: Not on file  Physical Activity: Not on file  Stress: Not on file  Social Connections: Not on file  Intimate Partner Violence: Not on file                                                                                                 Objective:  Physical Exam: BP 122/72 (BP Location: Left Arm, Patient Position: Sitting, Cuff Size: Large)   Pulse 70   Temp (!) 97.4 F (36.3 C) (Temporal)   Wt 171 lb 9.6 oz (77.8 kg)   SpO2 98%   BMI 29.00 kg/m    General: No acute distress. Awake and conversant.  Eyes: Normal conjunctiva, anicteric. Round symmetric pupils.  ENT: Hearing grossly intact. No nasal discharge.  Neck: Neck is supple. No masses or thyromegaly.  Respiratory: Respirations are non-labored. No auditory wheezing.  Skin: Warm. No rashes or ulcers.  Psych: Alert and oriented. Cooperative, Appropriate mood and affect, Normal judgment.  CV: No cyanosis or JVD MSK: Normal ambulation. No clubbing  Neuro: Sensation and CN II-XII grossly normal.       Alesia Banda, MD, MS

## 2022-08-25 LAB — COMPREHENSIVE METABOLIC PANEL
ALT: 23 U/L (ref 0–35)
AST: 22 U/L (ref 0–37)
Albumin: 4.7 g/dL (ref 3.5–5.2)
Alkaline Phosphatase: 90 U/L (ref 39–117)
BUN: 9 mg/dL (ref 6–23)
CO2: 28 mEq/L (ref 19–32)
Calcium: 10.1 mg/dL (ref 8.4–10.5)
Chloride: 102 mEq/L (ref 96–112)
Creatinine, Ser: 0.77 mg/dL (ref 0.40–1.20)
GFR: 88.1 mL/min (ref >60.00)
Glucose, Bld: 103 mg/dL — ABNORMAL HIGH (ref 70–99)
Potassium: 3.9 mEq/L (ref 3.5–5.1)
Sodium: 139 mEq/L (ref 135–145)
Total Bilirubin: 0.4 mg/dL (ref 0.2–1.2)
Total Protein: 7.4 g/dL (ref 6.0–8.3)

## 2022-08-25 LAB — HEPATITIS B SURFACE ANTIBODY,QUALITATIVE: Hep B S Ab: NONREACTIVE

## 2022-08-25 LAB — HEPATITIS C ANTIBODY: Hepatitis C Ab: NONREACTIVE

## 2022-09-11 ENCOUNTER — Other Ambulatory Visit (HOSPITAL_BASED_OUTPATIENT_CLINIC_OR_DEPARTMENT_OTHER): Payer: No Typology Code available for payment source

## 2022-10-11 ENCOUNTER — Telehealth (HOSPITAL_BASED_OUTPATIENT_CLINIC_OR_DEPARTMENT_OTHER): Payer: Self-pay

## 2022-10-16 ENCOUNTER — Encounter (HOSPITAL_BASED_OUTPATIENT_CLINIC_OR_DEPARTMENT_OTHER): Payer: Self-pay

## 2022-10-16 ENCOUNTER — Other Ambulatory Visit (HOSPITAL_BASED_OUTPATIENT_CLINIC_OR_DEPARTMENT_OTHER): Payer: No Typology Code available for payment source
